# Patient Record
Sex: Male | Born: 1965 | Race: Black or African American | Hispanic: No | Marital: Single | Smoking: Current some day smoker
Health system: Southern US, Community
[De-identification: ages and names within clinical notes are randomized; demographics above are authoritative.]

---

## 2016-03-09 ENCOUNTER — Emergency Department: Payer: Self-pay

## 2016-03-09 ENCOUNTER — Encounter: Payer: Self-pay | Admitting: Emergency Medicine

## 2016-03-09 ENCOUNTER — Emergency Department
Admission: EM | Admit: 2016-03-09 | Discharge: 2016-03-09 | Disposition: A | Discharge status: Home / Self Care | Payer: Self-pay | Attending: Emergency Medicine | Admitting: Emergency Medicine

## 2016-03-09 DIAGNOSIS — J069 Acute upper respiratory infection, unspecified: Secondary | ICD-10-CM | POA: Insufficient documentation

## 2016-03-09 DIAGNOSIS — F17290 Nicotine dependence, other tobacco product, uncomplicated: Secondary | ICD-10-CM | POA: Insufficient documentation

## 2016-03-09 LAB — INFLUENZA PANEL BY PCR (TYPE A & B)
INFLAPCR: NEGATIVE
Influenza B By PCR: NEGATIVE

## 2016-03-09 MED ORDER — AZITHROMYCIN 250 MG PO TABS: ORAL_TABLET | ORAL | 0 refills | Status: AC

## 2016-03-09 MED ORDER — ONDANSETRON HCL 4 MG PO TABS
4.0000 mg | ORAL_TABLET | Freq: Once | ORAL | Status: AC
  Administered 2016-03-09: 4 mg via ORAL
  Filled 2016-03-09: qty 1

## 2016-03-09 MED ORDER — PREDNISONE 10 MG (21) PO TBPK: ORAL_TABLET | ORAL | 0 refills | Status: AC

## 2016-03-09 NOTE — ED Notes (Signed)
See triage note  States he developed prod cough right ear pain and body aches about 7 days ago  Afebrile on arrival

## 2016-03-09 NOTE — ED Triage Notes (Addendum)
C/O productive cough x 7 days.  Right ear pain and nausea.  Intermittently running fevers.  Has not medicated for fever for 2 days.

## 2016-03-09 NOTE — ED Provider Notes (Signed)
[facility] Emergency Department Provider Note  ____________________________________________  Time seen: Approximately 3:57 PM  I have reviewed the triage vital signs and the nursing notes.   HISTORY  Chief Complaint Fever and Cough    HPI Barry Mills is a 51 y.o. male presents to the emergency department with 7 days of cough, congestion, right ear pain, muscle aches, and fatigue. Patient states that cough has recently become productive with yellow sputum. Patient states he is coughing so hard he occasionally vomits. Patient feels nauseous on and off. Patient states that he has not taken any medication for fever but feels like he is febrile. Patient denies congestion, shortness of breath, chest pain, abdominal pain, diarrhea.    History reviewed. No pertinent past medical history.  There are no active problems to display for this patient.   History reviewed. No pertinent surgical history.  Prior to Admission medications   Medication Sig Start Date End Date Taking? Authorizing Provider  azithromycin (ZITHROMAX Z-PAK) 250 MG tablet Take 2 tablets (500 mg) on  Day 1,  followed by 1 tablet (250 mg) once daily on Days 2 through 5. 03/09/16   Enid DerryAshley Yvetta Drotar, PA-C  predniSONE (STERAPRED UNI-PAK 21 TAB) 10 MG (21) TBPK tablet Take 6 tablets on day 1, take 5 tablets on day 2, take 4 tablets on day 3, take 3 tablets on day 4, take 2 tablets on day 5, take 1 tablet on day 6 03/09/16   Enid DerryAshley Luqman Perrelli, PA-C    Allergies Penicillins  No family history on file.  Social History Social History  Substance Use Topics  . Smoking status: Current Some Day Smoker    Types: Cigars  . Smokeless tobacco: Never Used  . Alcohol use No     Review of Systems  Eyes: No visual changes. No discharge. ENT: Positive for congestion and rhinorrhea. Cardiovascular: No chest pain. Respiratory: Positive for cough. No SOB. Gastrointestinal: No abdominal pain.  No diarrhea.  No  constipation. Musculoskeletal: Negative for musculoskeletal pain. Skin: Negative for rash, abrasions, lacerations, ecchymosis. Neurological: Negative for headaches.   ____________________________________________   PHYSICAL EXAM:  VITAL SIGNS: ED Triage Vitals  Enc Vitals Group     BP 03/09/16 1511 137/84     Pulse Rate 03/09/16 1511 100     Resp 03/09/16 1511 18     Temp 03/09/16 1511 97.8 F (36.6 C)     Temp Source 03/09/16 1511 Oral     SpO2 03/09/16 1511 100 %     Weight 03/09/16 1512 240 lb (108.9 kg)     Height 03/09/16 1512 6\' 1"  (1.854 m)     Head Circumference --      Peak Flow --      Pain Score 03/09/16 1517 6     Pain Loc --      Pain Edu? --      Excl. in GC? --      Constitutional: Alert and oriented. Well appearing and in no acute distress. Eyes: Conjunctivae are normal. PERRL. EOMI. No discharge. Head: Atraumatic. ENT: No frontal and maxillary sinus tenderness.      Ears: Tympanic membranes pearly gray with good landmarks. No discharge.      Nose: Mild congestion/rhinnorhea.      Mouth/Throat: Mucous membranes are moist. Oropharynx non-erythematous. Tonsils not enlarged. No exudates. Uvula midline. Neck: No stridor.   Hematological/Lymphatic/Immunilogical: No cervical lymphadenopathy. Cardiovascular: Normal rate, regular rhythm. Good peripheral circulation. Respiratory: Normal respiratory effort without tachypnea or retractions. Lungs CTAB.  Good air entry to the bases with no decreased or absent breath sounds. Gastrointestinal: Bowel sounds 4 quadrants. Soft and nontender to palpation. No guarding or rigidity. No palpable masses. No distention. Musculoskeletal: Full range of motion to all extremities. No gross deformities appreciated. Neurologic:  Normal speech and language. No gross focal neurologic deficits are appreciated.  Skin:  Skin is warm, dry and intact. No rash noted. Psychiatric: Mood and affect are normal. Speech and behavior are normal.  Patient exhibits appropriate insight and judgement.   ____________________________________________   LABS (all labs ordered are listed, but only abnormal results are displayed)  Labs Reviewed  INFLUENZA PANEL BY PCR (TYPE A & B)   ____________________________________________  EKG   ____________________________________________  RADIOLOGY Lexine Baton, personally viewed and evaluated these images (plain radiographs) as part of my medical decision making, as well as reviewing the written report by the radiologist.  Dg Chest 2 View  Result Date: 03/09/2016 CLINICAL DATA:  Productive cough for 7 days. Right ear pain and nausea. EXAM: CHEST  2 VIEW COMPARISON:  None. FINDINGS: The heart size and mediastinal contours are normal. The lungs are clear. There is no pleural effusion or pneumothorax. No acute osseous findings are identified. IMPRESSION: No active cardiopulmonary process. Electronically Signed   By: Carey Bullocks M.D.   On: 03/09/2016 16:19    ____________________________________________    PROCEDURES  Procedure(s) performed:    Procedures    Medications  ondansetron (ZOFRAN) tablet 4 mg (4 mg Oral Given 03/09/16 1630)     ____________________________________________   INITIAL IMPRESSION / ASSESSMENT AND PLAN / ED COURSE  Pertinent labs & imaging results that were available during my care of the patient were reviewed by me and considered in my medical decision making (see chart for details).  Review of the [place] CSRS was performed in accordance of the [employer] prior to dispensing any controlled drugs.     Patient's diagnosis is consistent with upper respiratory infection. Vital signs and exam are reassuring. Chest x-ray negative for any acute cardiopulmonary processes. Influenza negative. He is actively eating crackers and drinking ginger ale in ED. Patient will be discharged home with prescriptions for azithromycin and prednisone. Patient is to follow up with  PCP as needed or otherwise directed. Patient is given ED precautions to return to the ED for any worsening or new symptoms.     ____________________________________________  FINAL CLINICAL IMPRESSION(S) / ED DIAGNOSES  Final diagnoses:  Upper respiratory tract infection, unspecified type      NEW MEDICATIONS STARTED DURING THIS VISIT:  Discharge Medication List as of 03/09/2016  4:36 PM    START taking these medications   Details  azithromycin (ZITHROMAX Z-PAK) 250 MG tablet Take 2 tablets (500 mg) on  Day 1,  followed by 1 tablet (250 mg) once daily on Days 2 through 5., Print    predniSONE (STERAPRED UNI-PAK 21 TAB) 10 MG (21) TBPK tablet Take 6 tablets on day 1, take 5 tablets on day 2, take 4 tablets on day 3, take 3 tablets on day 4, take 2 tablets on day 5, take 1 tablet on day 6, Print            This chart was dictated using voice recognition software/Dragon. Despite best efforts to proofread, errors can occur which can change the meaning. Any change was purely unintentional.    Enid Derry, PA-C 03/09/16 1650    Jene Every, MD 03/10/16 [phone]

## 2018-07-15 IMAGING — CR DG CHEST 2V
1 series · 2 of 2 positions shown · non-contrast
Comparison: None.

CLINICAL DATA: Productive cough for 7 days. Right ear pain and
nausea.

EXAM:
CHEST  2 VIEW

[Series 1: dg chest 2 view · 0.14mm/px · 2 of 2 slices shown]
[im 1/2]
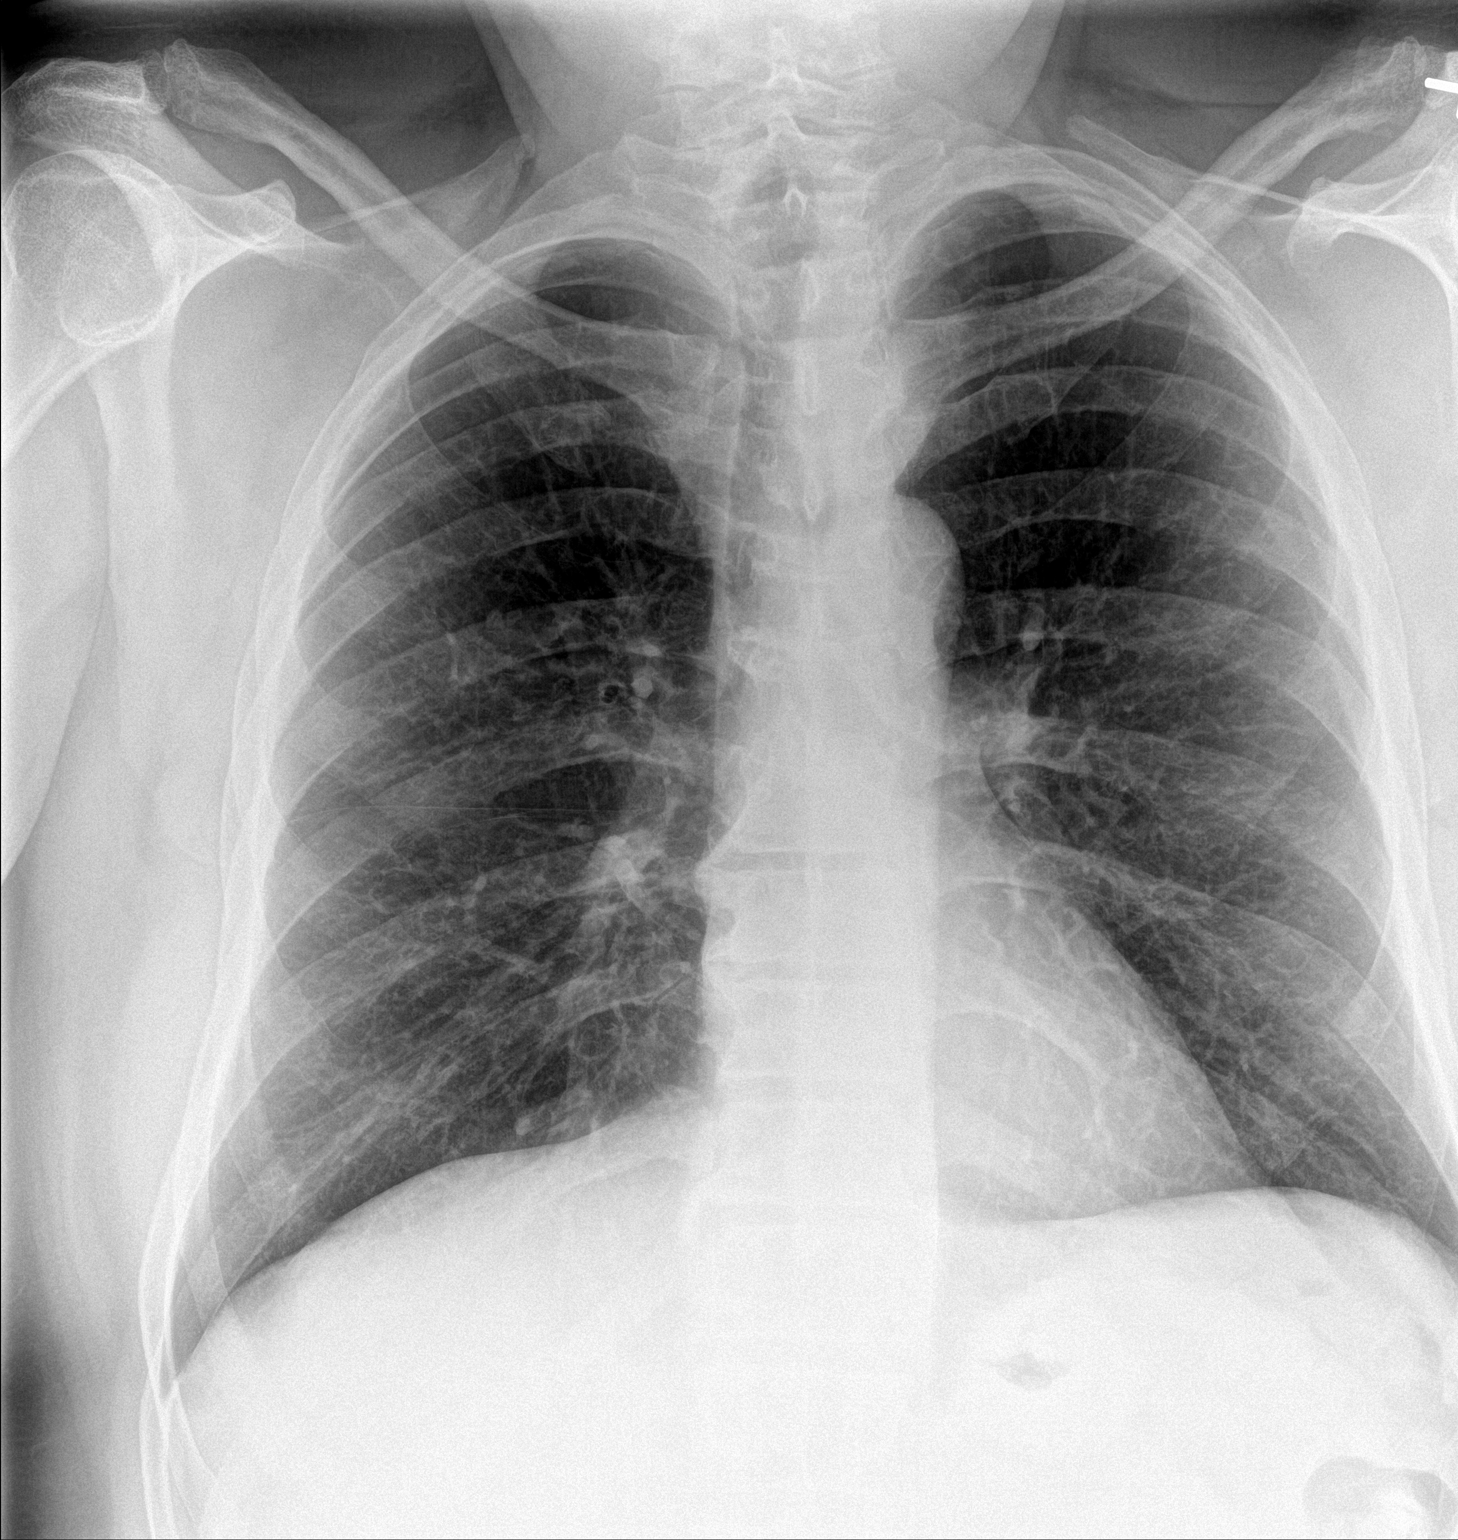
[im 2/2]
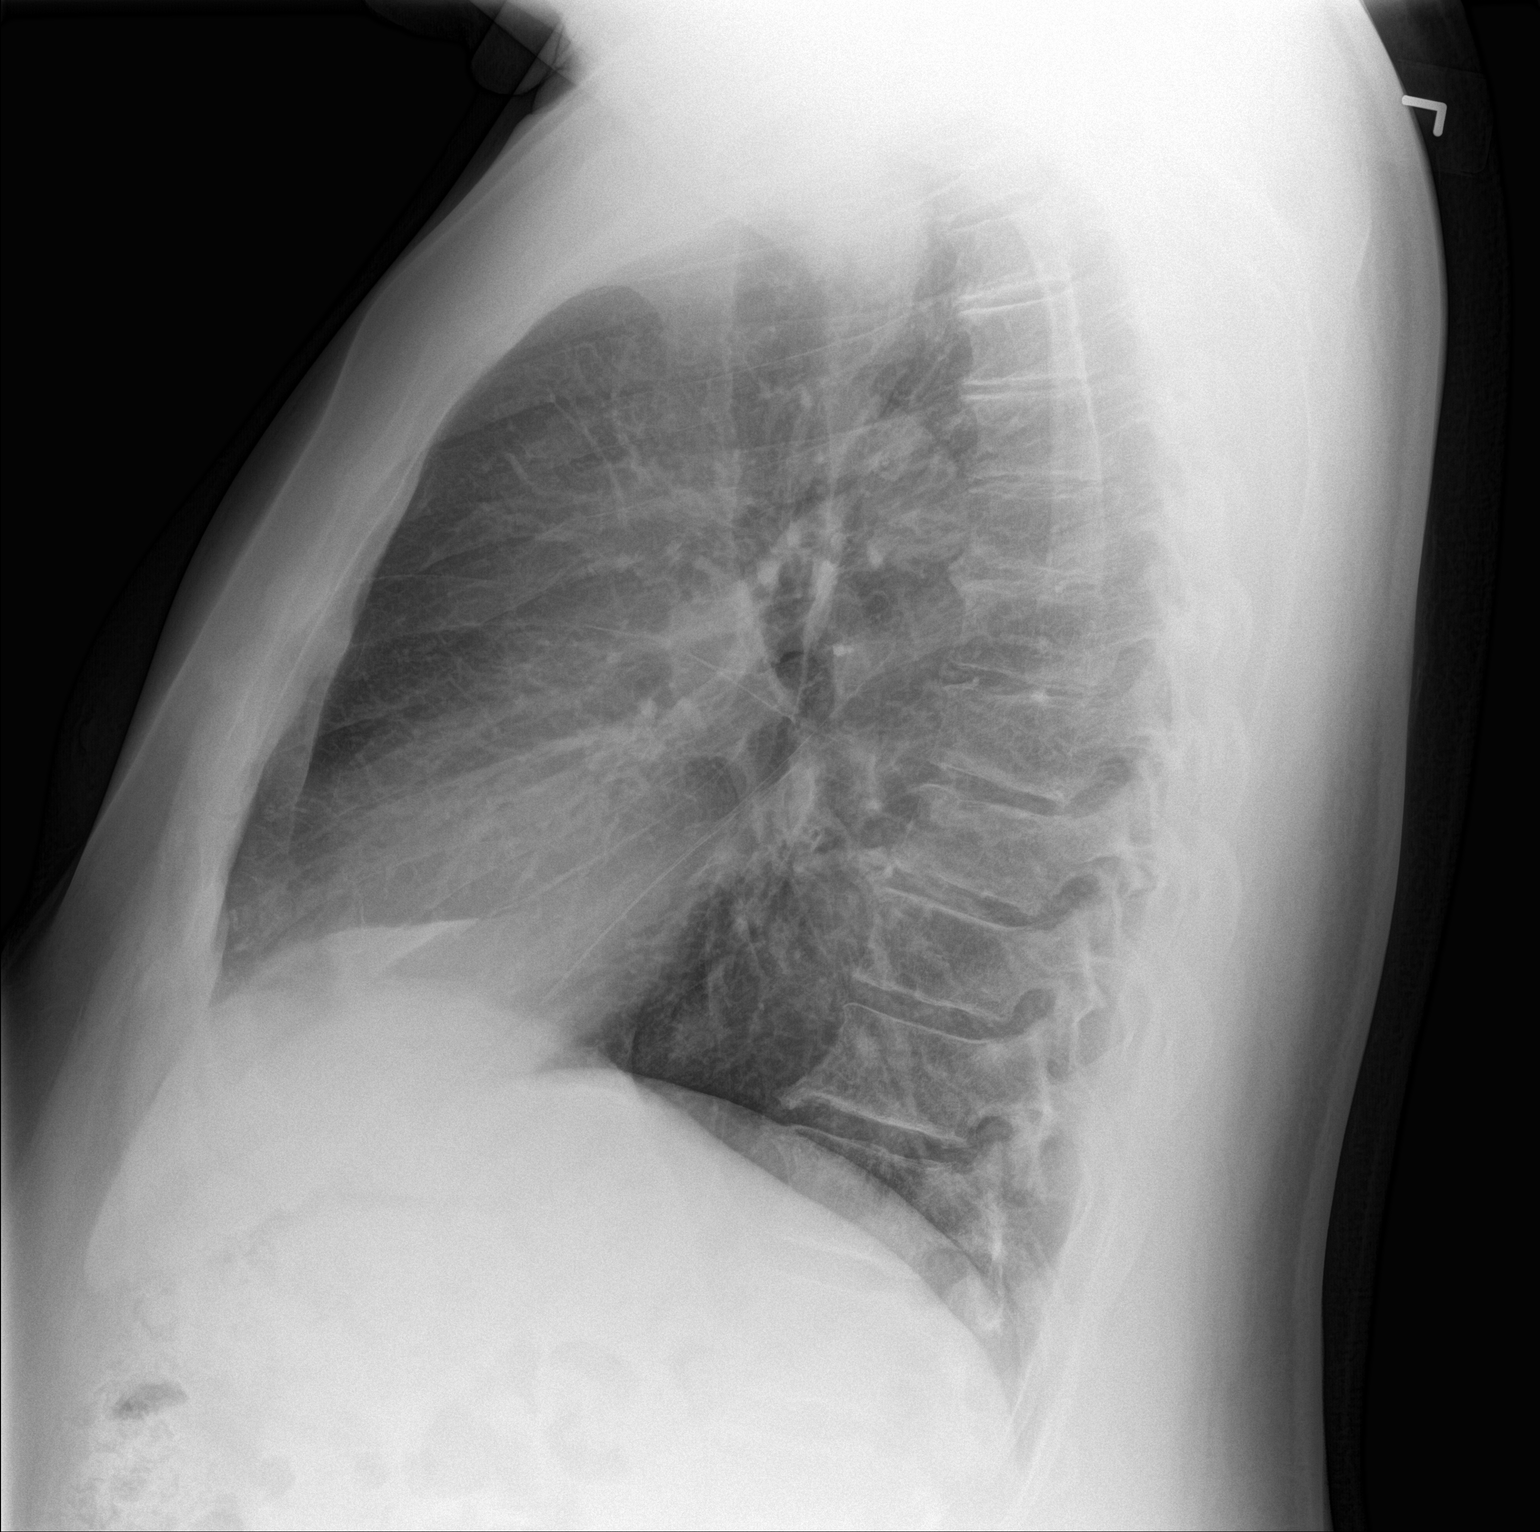

[2 of 2 positions shown; findings below may reference images not displayed]

FINDINGS: The heart size and mediastinal contours are normal. The lungs are
clear. There is no pleural effusion or pneumothorax. No acute
osseous findings are identified.
IMPRESSION: No active cardiopulmonary process.
